# Patient Record
Sex: Male | Born: 1965 | Race: Black or African American | Hispanic: No | Marital: Single | Smoking: Never smoker
Health system: Southern US, Community
[De-identification: ages and names within clinical notes are randomized; demographics above are authoritative.]

## PROBLEM LIST (undated history)

## (undated) DIAGNOSIS — J45909 Unspecified asthma, uncomplicated: Secondary | ICD-10-CM

## (undated) DIAGNOSIS — I10 Essential (primary) hypertension: Secondary | ICD-10-CM

## (undated) HISTORY — PX: CYST EXCISION: SHX5701

---

## 2018-06-05 ENCOUNTER — Encounter (HOSPITAL_COMMUNITY): Payer: Self-pay | Admitting: *Deleted

## 2018-06-05 ENCOUNTER — Emergency Department (HOSPITAL_COMMUNITY): Payer: Self-pay

## 2018-06-05 ENCOUNTER — Emergency Department (HOSPITAL_COMMUNITY)
Admission: EM | Admit: 2018-06-05 | Discharge: 2018-06-05 | Disposition: A | Discharge status: Home / Self Care | Payer: Self-pay | Attending: Emergency Medicine | Admitting: Emergency Medicine

## 2018-06-05 DIAGNOSIS — Z87891 Personal history of nicotine dependence: Secondary | ICD-10-CM | POA: Insufficient documentation

## 2018-06-05 DIAGNOSIS — Y998 Other external cause status: Secondary | ICD-10-CM | POA: Insufficient documentation

## 2018-06-05 DIAGNOSIS — S86812A Strain of other muscle(s) and tendon(s) at lower leg level, left leg, initial encounter: Secondary | ICD-10-CM | POA: Insufficient documentation

## 2018-06-05 DIAGNOSIS — Y92310 Basketball court as the place of occurrence of the external cause: Secondary | ICD-10-CM | POA: Insufficient documentation

## 2018-06-05 DIAGNOSIS — Y9367 Activity, basketball: Secondary | ICD-10-CM | POA: Insufficient documentation

## 2018-06-05 DIAGNOSIS — W500XXA Accidental hit or strike by another person, initial encounter: Secondary | ICD-10-CM | POA: Insufficient documentation

## 2018-06-05 DIAGNOSIS — W19XXXA Unspecified fall, initial encounter: Secondary | ICD-10-CM

## 2018-06-05 MED ORDER — HYDROCODONE-ACETAMINOPHEN 5-325 MG PO TABS: 1.0000 | ORAL_TABLET | ORAL | 0 refills | Status: DC | PRN

## 2018-06-05 MED ORDER — HYDROCODONE-ACETAMINOPHEN 5-325 MG PO TABS
1.0000 | ORAL_TABLET | Freq: Once | ORAL | Status: AC
  Administered 2018-06-05: 1 via ORAL
  Filled 2018-06-05: qty 1

## 2018-06-05 NOTE — ED Notes (Signed)
Patient transported to X-ray 

## 2018-06-05 NOTE — ED Notes (Signed)
Discharge instructions and prescription discussed with Pt. Pt verbalized understanding. Pt stable and ambulatory with crutches.    

## 2018-06-05 NOTE — Discharge Instructions (Addendum)
Dr. Carola Frost is expecting you to call his office as soon as possible on Monday to speak about scheduling for surgery for your knee.   It is okay to bear some weight on your left knee if you would like.  Ice your knee as much as possible.  Please be advised that the medication prescribed is a narcotic and may make you sleepy or drowsy and may cause addiction or overdose even if taken as prescribed.  Please only take the medication for severe pain.

## 2018-06-05 NOTE — ED Triage Notes (Signed)
Pt reports he injured his t knee while playing basketball today

## 2018-06-05 NOTE — ED Provider Notes (Signed)
[facility] EMERGENCY DEPARTMENT Provider Note   CSN: [id number] Arrival date & time: 06/05/18  1844    History   Chief Complaint Chief Complaint  Patient presents with  . Knee Pain    HPI Miguel Delgado is a 53 y.o. male.     Patient is a 53 year old gentleman with no past medical history presents the emergency department for left knee injury after playing basketball today.  Patient reports that he was jumping up to go on for a lay up when someone ran into the lateral side of his left knee.  Reports immediate pain and falling to the ground.  Reports that it seemed like his knee was stuck in flexion when he tried to stand up and he had to push it out to extension on its own.  Reports minimal pain but a deformity to his knee and inability to walk correctly.     History reviewed. No pertinent past medical history.  There are no active problems to display for this patient.   History reviewed. No pertinent surgical history.      Home Medications    Prior to Admission medications   Medication Sig Start Date End Date Taking? Authorizing Provider  HYDROcodone-acetaminophen (NORCO/VICODIN) 5-325 MG tablet Take 1 tablet by mouth every 4 (four) hours as needed for up to 3 days. 06/05/18 06/08/18  Arlyn Dunning PA-C    Family History History reviewed. No pertinent family history.  Social History Social History   Tobacco Use  . Smoking status: Never Smoker  . Smokeless tobacco: Former Engineer, water Use Topics  . Alcohol use: Yes    Comment: SOCIAL  . Drug use: Not on file     Allergies   Patient has no known allergies.   Review of Systems Review of Systems   Physical Exam Updated Vital Signs BP (!) 140/95 (BP Location: Right Arm)   Pulse 92   Temp 99.2 F (37.3 C) (Oral)   Resp 14   Ht 5\' 9"  (1.753 m)   Wt 90.3 kg   SpO2 96%   BMI 29.39 kg/m   Physical Exam   ED Treatments / Results  Labs (all labs ordered are listed, but  only abnormal results are displayed) Labs Reviewed - No data to display  EKG None  Radiology Dg Knee Ap/lat W/sunrise Left  Result Date: 06/05/2018 CLINICAL DATA:  Knee injury playing basketball today EXAM: LEFT KNEE 3 VIEWS COMPARISON:  None. FINDINGS: Prominent patellar spurring at the quadriceps attachment site and patellar tendon attachment site. Faint calcification below the patella with surrounding indistinct stranding in Hoffa's fat pad, query patellar tendon injury or avulsion. Insall-Salvati ratio is 1.6, compatible with patella alta. IMPRESSION: 1. Patella alta, with indistinctness of tissue planes around the patellar tendon and a faint calcification in the expected region of the patellar tendon, patellar tendon tear or rupture is a distinct possibility. Correlate with excessive mobility of the patella. 2. Quadriceps and patellar tendon spurring along the patellar margins. Electronically Signed   By: Gaylyn Rong M.D.   On: 06/05/2018 19:39    Procedures Procedures (including critical care time)  Medications Ordered in ED Medications  HYDROcodone-acetaminophen (NORCO/VICODIN) 5-325 MG per tablet 1 tablet (has no administration in time range)     Initial Impression / Assessment and Plan / ED Course  I have reviewed the triage vital signs and the nursing notes.  Pertinent labs & imaging results that were available during my care of the patient were  reviewed by me and considered in my medical decision making (see chart for details).  Clinical Course as of Jun 05 1998  Sat Jun 05, 2018  1959 I discussed the case with Dr. Marcello Fennel, orthopedic surgeon on call.  Advised to place in the immobilizer and have him call the office for scheduling surgery on Monday.  Plan discussed with patient.  I discussed treatment options for the patients pain to include narcotic and non-narcotic medications. I discussed the risks of narcotic medications in full detail to include overdose and  addiction. Patient verbalized full understanding of risks of narcotic medication but still insisted to take rx for narcotic pain medication due to the severity of their pain.  Prior to providing a prescription for a controlled substance, I independently reviewed the patient's recent prescription history on the [place] Controlled Substance Reporting System. The patient had no recent or regular prescriptions and was deemed appropriate for a brief, less than 3 day prescription of narcotic for acute analgesia.     [KM]    Clinical Course User Index [KM] Arlyn Dunning, PA-C         Final Clinical Impressions(s) / ED Diagnoses   Final diagnoses:  Patellar tendon rupture, left, initial encounter  Fall, initial encounter    ED Discharge Orders         Ordered    HYDROcodone-acetaminophen (NORCO/VICODIN) 5-325 MG tablet  Every 4 hours PRN     06/05/18 1959           Jeral Pinch 06/05/18 Ritta Slot, MD 06/05/18 2105

## 2018-06-07 ENCOUNTER — Encounter (HOSPITAL_COMMUNITY): Payer: Self-pay | Admitting: *Deleted

## 2018-06-07 ENCOUNTER — Other Ambulatory Visit: Payer: Self-pay

## 2018-06-07 NOTE — Progress Notes (Signed)
Denies chest pain, shob, or cardiology visit. Denies new cough, sore throat, unexplained body aches, or fever > 100. Denies travel history out of state. Made aware of visitor restrictions and verbalized understanding.

## 2018-06-08 ENCOUNTER — Ambulatory Visit (HOSPITAL_COMMUNITY): Payer: Self-pay | Admitting: Certified Registered Nurse Anesthetist

## 2018-06-08 ENCOUNTER — Other Ambulatory Visit: Payer: Self-pay

## 2018-06-08 ENCOUNTER — Encounter (HOSPITAL_COMMUNITY)
Admission: RE | Disposition: A | Discharge status: Home / Self Care | Payer: Self-pay | Source: Home / Self Care | Attending: Orthopedic Surgery

## 2018-06-08 ENCOUNTER — Encounter (HOSPITAL_COMMUNITY): Payer: Self-pay

## 2018-06-08 ENCOUNTER — Ambulatory Visit (HOSPITAL_COMMUNITY): Payer: Self-pay

## 2018-06-08 ENCOUNTER — Ambulatory Visit (HOSPITAL_COMMUNITY)
Admission: RE | Admit: 2018-06-08 | Discharge: 2018-06-08 | Disposition: A | Discharge status: Home / Self Care | Payer: Self-pay | Attending: Orthopedic Surgery | Admitting: Orthopedic Surgery

## 2018-06-08 DIAGNOSIS — Z419 Encounter for procedure for purposes other than remedying health state, unspecified: Secondary | ICD-10-CM

## 2018-06-08 DIAGNOSIS — Y9367 Activity, basketball: Secondary | ICD-10-CM | POA: Insufficient documentation

## 2018-06-08 DIAGNOSIS — Z01818 Encounter for other preprocedural examination: Secondary | ICD-10-CM

## 2018-06-08 DIAGNOSIS — S86812A Strain of other muscle(s) and tendon(s) at lower leg level, left leg, initial encounter: Secondary | ICD-10-CM | POA: Insufficient documentation

## 2018-06-08 DIAGNOSIS — S86819A Strain of other muscle(s) and tendon(s) at lower leg level, unspecified leg, initial encounter: Secondary | ICD-10-CM

## 2018-06-08 DIAGNOSIS — J45909 Unspecified asthma, uncomplicated: Secondary | ICD-10-CM | POA: Insufficient documentation

## 2018-06-08 DIAGNOSIS — Z79899 Other long term (current) drug therapy: Secondary | ICD-10-CM | POA: Insufficient documentation

## 2018-06-08 DIAGNOSIS — I10 Essential (primary) hypertension: Secondary | ICD-10-CM | POA: Insufficient documentation

## 2018-06-08 DIAGNOSIS — Z87891 Personal history of nicotine dependence: Secondary | ICD-10-CM | POA: Insufficient documentation

## 2018-06-08 HISTORY — PX: REPAIR OF RUPTURED PATELLA LIGAMENT: SHX6066

## 2018-06-08 HISTORY — DX: Essential (primary) hypertension: I10

## 2018-06-08 HISTORY — DX: Unspecified asthma, uncomplicated: J45.909

## 2018-06-08 LAB — COMPREHENSIVE METABOLIC PANEL
ALT: 21 U/L (ref 0–44)
AST: 19 U/L (ref 15–41)
Albumin: 3.4 g/dL — ABNORMAL LOW (ref 3.5–5.0)
Alkaline Phosphatase: 62 U/L (ref 38–126)
Anion gap: 9 (ref 5–15)
BUN: 18 mg/dL (ref 6–20)
CO2: 23 mmol/L (ref 22–32)
Calcium: 8.7 mg/dL — ABNORMAL LOW (ref 8.9–10.3)
Chloride: 106 mmol/L (ref 98–111)
Creatinine, Ser: 0.99 mg/dL (ref 0.61–1.24)
GFR calc Af Amer: >60 mL/min (ref >60)
Glucose, Bld: 105 mg/dL — ABNORMAL HIGH (ref 70–99)
Potassium: 3.6 mmol/L (ref 3.5–5.1)
Sodium: 138 mmol/L (ref 135–145)
Total Bilirubin: 1 mg/dL (ref 0.3–1.2)
Total Protein: 6.8 g/dL (ref 6.5–8.1)

## 2018-06-08 LAB — CBC
HCT: 49.6 % (ref 39.0–52.0)
Hemoglobin: 15.4 g/dL (ref 13.0–17.0)
MCH: 24.8 pg — AB (ref 26.0–34.0)
MCHC: 31 g/dL (ref 30.0–36.0)
MCV: 79.7 fL — ABNORMAL LOW (ref 80.0–100.0)
Platelets: 197 10*3/uL (ref 150–400)
RBC: 6.22 MIL/uL — ABNORMAL HIGH (ref 4.22–5.81)
RDW: 15.3 % (ref 11.5–15.5)
WBC: 6.4 10*3/uL (ref 4.0–10.5)
nRBC: 0 % (ref 0.0–0.2)

## 2018-06-08 SURGERY — REPAIR OF RUPTURED PATELLA LIGAMENT
Anesthesia: General | Laterality: Left

## 2018-06-08 MED ORDER — PROMETHAZINE HCL 25 MG/ML IJ SOLN: 6.2500 mg | INTRAMUSCULAR | Status: DC | PRN

## 2018-06-08 MED ORDER — FENTANYL CITRATE (PF) 250 MCG/5ML IJ SOLN
INTRAMUSCULAR | Status: AC
  Filled 2018-06-08: qty 5

## 2018-06-08 MED ORDER — EPHEDRINE 5 MG/ML INJ
INTRAVENOUS | Status: AC
  Filled 2018-06-08: qty 10

## 2018-06-08 MED ORDER — OXYCODONE HCL 5 MG PO TABS: 5.0000 mg | ORAL_TABLET | Freq: Once | ORAL | Status: DC | PRN

## 2018-06-08 MED ORDER — DEXAMETHASONE SODIUM PHOSPHATE 10 MG/ML IJ SOLN
INTRAMUSCULAR | Status: DC | PRN
  Administered 2018-06-08: 5 mg via INTRAVENOUS

## 2018-06-08 MED ORDER — FENTANYL CITRATE (PF) 250 MCG/5ML IJ SOLN
INTRAMUSCULAR | Status: DC | PRN
  Administered 2018-06-08 (×2): 25 ug via INTRAVENOUS
  Administered 2018-06-08 (×2): 50 ug via INTRAVENOUS
  Administered 2018-06-08: 25 ug via INTRAVENOUS

## 2018-06-08 MED ORDER — EPHEDRINE SULFATE 50 MG/ML IJ SOLN
INTRAMUSCULAR | Status: DC | PRN
  Administered 2018-06-08 (×2): 10 mg via INTRAVENOUS

## 2018-06-08 MED ORDER — SUCCINYLCHOLINE CHLORIDE 20 MG/ML IJ SOLN
INTRAMUSCULAR | Status: DC | PRN
  Administered 2018-06-08: 120 mg via INTRAVENOUS

## 2018-06-08 MED ORDER — ONDANSETRON HCL 4 MG/2ML IJ SOLN
INTRAMUSCULAR | Status: AC
  Filled 2018-06-08: qty 2

## 2018-06-08 MED ORDER — KETOROLAC TROMETHAMINE 10 MG PO TABS: 10.0000 mg | ORAL_TABLET | Freq: Four times a day (QID) | ORAL | 0 refills | Status: AC | PRN

## 2018-06-08 MED ORDER — HYDROCODONE-ACETAMINOPHEN 5-325 MG PO TABS: 1.0000 | ORAL_TABLET | Freq: Four times a day (QID) | ORAL | 0 refills | Status: AC | PRN

## 2018-06-08 MED ORDER — LIDOCAINE 2% (20 MG/ML) 5 ML SYRINGE
INTRAMUSCULAR | Status: AC
  Filled 2018-06-08: qty 5

## 2018-06-08 MED ORDER — CEFAZOLIN SODIUM-DEXTROSE 2-4 GM/100ML-% IV SOLN
2.0000 g | INTRAVENOUS | Status: AC
  Administered 2018-06-08: 2 g via INTRAVENOUS

## 2018-06-08 MED ORDER — MIDAZOLAM HCL 2 MG/2ML IJ SOLN
INTRAMUSCULAR | Status: DC | PRN
  Administered 2018-06-08: 2 mg via INTRAVENOUS

## 2018-06-08 MED ORDER — LACTATED RINGERS IV SOLN
INTRAVENOUS | Status: DC
  Administered 2018-06-08 (×2): via INTRAVENOUS

## 2018-06-08 MED ORDER — ONDANSETRON 4 MG PO TBDP: 4.0000 mg | ORAL_TABLET | Freq: Three times a day (TID) | ORAL | 0 refills | Status: AC | PRN

## 2018-06-08 MED ORDER — ROPIVACAINE HCL 5 MG/ML IJ SOLN
INTRAMUSCULAR | Status: DC | PRN
  Administered 2018-06-08: 30 mL via PERINEURAL

## 2018-06-08 MED ORDER — CEFAZOLIN SODIUM-DEXTROSE 2-4 GM/100ML-% IV SOLN
INTRAVENOUS | Status: AC
  Filled 2018-06-08: qty 100

## 2018-06-08 MED ORDER — ONDANSETRON HCL 4 MG/2ML IJ SOLN
INTRAMUSCULAR | Status: DC | PRN
  Administered 2018-06-08: 4 mg via INTRAVENOUS

## 2018-06-08 MED ORDER — FENTANYL CITRATE (PF) 100 MCG/2ML IJ SOLN
INTRAMUSCULAR | Status: AC
  Administered 2018-06-08: 100 ug via INTRAVENOUS
  Filled 2018-06-08: qty 2

## 2018-06-08 MED ORDER — LIDOCAINE 2% (20 MG/ML) 5 ML SYRINGE
INTRAMUSCULAR | Status: DC | PRN
  Administered 2018-06-08: 80 mg via INTRAVENOUS

## 2018-06-08 MED ORDER — GABAPENTIN 300 MG PO CAPS
ORAL_CAPSULE | ORAL | Status: AC
  Administered 2018-06-08: 300 mg via ORAL
  Filled 2018-06-08: qty 1

## 2018-06-08 MED ORDER — PHENYLEPHRINE 40 MCG/ML (10ML) SYRINGE FOR IV PUSH (FOR BLOOD PRESSURE SUPPORT)
PREFILLED_SYRINGE | INTRAVENOUS | Status: AC
  Filled 2018-06-08: qty 10

## 2018-06-08 MED ORDER — METHOCARBAMOL 500 MG PO TABS: 500.0000 mg | ORAL_TABLET | Freq: Four times a day (QID) | ORAL | 1 refills | Status: AC

## 2018-06-08 MED ORDER — FENTANYL CITRATE (PF) 100 MCG/2ML IJ SOLN
100.0000 ug | Freq: Once | INTRAMUSCULAR | Status: AC
  Administered 2018-06-08: 100 ug via INTRAVENOUS

## 2018-06-08 MED ORDER — DEXAMETHASONE SODIUM PHOSPHATE 10 MG/ML IJ SOLN
INTRAMUSCULAR | Status: AC
  Filled 2018-06-08: qty 1

## 2018-06-08 MED ORDER — SUCCINYLCHOLINE CHLORIDE 200 MG/10ML IV SOSY
PREFILLED_SYRINGE | INTRAVENOUS | Status: AC
  Filled 2018-06-08: qty 10

## 2018-06-08 MED ORDER — MIDAZOLAM HCL 2 MG/2ML IJ SOLN
2.0000 mg | Freq: Once | INTRAMUSCULAR | Status: AC
  Administered 2018-06-08: 2 mg via INTRAVENOUS

## 2018-06-08 MED ORDER — KETOROLAC TROMETHAMINE 15 MG/ML IJ SOLN: 15.0000 mg | Freq: Once | INTRAMUSCULAR | Status: DC

## 2018-06-08 MED ORDER — MIDAZOLAM HCL 2 MG/2ML IJ SOLN
INTRAMUSCULAR | Status: AC
  Filled 2018-06-08: qty 2

## 2018-06-08 MED ORDER — CHLORHEXIDINE GLUCONATE 4 % EX LIQD: 60.0000 mL | Freq: Once | CUTANEOUS | Status: DC

## 2018-06-08 MED ORDER — POVIDONE-IODINE 10 % EX SWAB: 2.0000 "application " | Freq: Once | CUTANEOUS | Status: DC

## 2018-06-08 MED ORDER — PROPOFOL 10 MG/ML IV BOLUS
INTRAVENOUS | Status: AC
  Filled 2018-06-08: qty 40

## 2018-06-08 MED ORDER — ACETAMINOPHEN 500 MG PO TABS
ORAL_TABLET | ORAL | Status: AC
  Administered 2018-06-08: 1000 mg via ORAL
  Filled 2018-06-08: qty 2

## 2018-06-08 MED ORDER — CELECOXIB 200 MG PO CAPS
400.0000 mg | ORAL_CAPSULE | Freq: Once | ORAL | Status: AC
  Administered 2018-06-08: 400 mg via ORAL

## 2018-06-08 MED ORDER — MEPERIDINE HCL 50 MG/ML IJ SOLN: 6.2500 mg | INTRAMUSCULAR | Status: DC | PRN

## 2018-06-08 MED ORDER — CELECOXIB 200 MG PO CAPS
ORAL_CAPSULE | ORAL | Status: AC
  Administered 2018-06-08: 400 mg via ORAL
  Filled 2018-06-08: qty 2

## 2018-06-08 MED ORDER — OXYCODONE HCL 5 MG/5ML PO SOLN: 5.0000 mg | Freq: Once | ORAL | Status: DC | PRN

## 2018-06-08 MED ORDER — MIDAZOLAM HCL 2 MG/2ML IJ SOLN
INTRAMUSCULAR | Status: AC
  Administered 2018-06-08: 2 mg via INTRAVENOUS
  Filled 2018-06-08: qty 2

## 2018-06-08 MED ORDER — ACETAMINOPHEN 500 MG PO TABS
1000.0000 mg | ORAL_TABLET | Freq: Once | ORAL | Status: AC
  Administered 2018-06-08: 1000 mg via ORAL

## 2018-06-08 MED ORDER — PROPOFOL 10 MG/ML IV BOLUS
INTRAVENOUS | Status: DC | PRN
  Administered 2018-06-08: 200 mg via INTRAVENOUS

## 2018-06-08 MED ORDER — GABAPENTIN 300 MG PO CAPS
300.0000 mg | ORAL_CAPSULE | Freq: Once | ORAL | Status: AC
  Administered 2018-06-08: 300 mg via ORAL

## 2018-06-08 MED ORDER — PHENYLEPHRINE HCL 10 MG/ML IJ SOLN
INTRAMUSCULAR | Status: DC | PRN
  Administered 2018-06-08: 120 ug via INTRAVENOUS
  Administered 2018-06-08 (×3): 80 ug via INTRAVENOUS

## 2018-06-08 MED ORDER — HYDROMORPHONE HCL 1 MG/ML IJ SOLN: 0.2500 mg | INTRAMUSCULAR | Status: DC | PRN

## 2018-06-08 SURGICAL SUPPLY — 61 items
BANDAGE ACE 4X5 VEL STRL LF (GAUZE/BANDAGES/DRESSINGS) ×3 IMPLANT
BANDAGE ACE 6X5 VEL STRL LF (GAUZE/BANDAGES/DRESSINGS) ×3 IMPLANT
BLADE CLIPPER SURG (BLADE) ×3 IMPLANT
BLADE SURG 10 STRL SS (BLADE) IMPLANT
BNDG GAUZE ELAST 4 BULKY (GAUZE/BANDAGES/DRESSINGS) ×6 IMPLANT
BRUSH SCRUB SURG 4.25 DISP (MISCELLANEOUS) ×6 IMPLANT
COVER SURGICAL LIGHT HANDLE (MISCELLANEOUS) ×6 IMPLANT
COVER WAND RF STERILE (DRAPES) ×3 IMPLANT
CUFF TOURNIQUET SINGLE 34IN LL (TOURNIQUET CUFF) IMPLANT
CUFF TOURNIQUET SINGLE 44IN (TOURNIQUET CUFF) IMPLANT
DECANTER SPIKE VIAL GLASS SM (MISCELLANEOUS) IMPLANT
DRAPE C-ARM 42X72 X-RAY (DRAPES) ×2 IMPLANT
DRAPE C-ARMOR (DRAPES) ×3 IMPLANT
DRSG ADAPTIC 3X8 NADH LF (GAUZE/BANDAGES/DRESSINGS) ×3 IMPLANT
DRSG EMULSION OIL 3X3 NADH (GAUZE/BANDAGES/DRESSINGS) ×3 IMPLANT
DRSG PAD ABDOMINAL 8X10 ST (GAUZE/BANDAGES/DRESSINGS) ×3 IMPLANT
ELECT REM PT RETURN 9FT ADLT (ELECTROSURGICAL) ×3
ELECTRODE REM PT RTRN 9FT ADLT (ELECTROSURGICAL) ×1 IMPLANT
GAUZE SPONGE 4X4 12PLY STRL (GAUZE/BANDAGES/DRESSINGS) ×3 IMPLANT
GLOVE BIO SURGEON STRL SZ7.5 (GLOVE) ×3 IMPLANT
GLOVE BIO SURGEON STRL SZ8 (GLOVE) ×3 IMPLANT
GLOVE BIOGEL PI IND STRL 7.5 (GLOVE) ×1 IMPLANT
GLOVE BIOGEL PI IND STRL 8 (GLOVE) ×1 IMPLANT
GLOVE BIOGEL PI INDICATOR 7.5 (GLOVE) ×2
GLOVE BIOGEL PI INDICATOR 8 (GLOVE) ×2
GOWN STRL REUS W/ TWL XL LVL3 (GOWN DISPOSABLE) ×1 IMPLANT
GOWN STRL REUS W/TWL 2XL LVL3 (GOWN DISPOSABLE) ×6 IMPLANT
GOWN STRL REUS W/TWL XL LVL3 (GOWN DISPOSABLE) ×2
KIT BASIN OR (CUSTOM PROCEDURE TRAY) ×3 IMPLANT
KIT TURNOVER KIT B (KITS) ×3 IMPLANT
MANIFOLD NEPTUNE II (INSTRUMENTS) ×3 IMPLANT
NEEDLE 22X1 1/2 (OR ONLY) (NEEDLE) IMPLANT
NS IRRIG 1000ML POUR BTL (IV SOLUTION) ×3 IMPLANT
PACK ORTHO EXTREMITY (CUSTOM PROCEDURE TRAY) ×3 IMPLANT
PAD ABD 8X10 STRL (GAUZE/BANDAGES/DRESSINGS) ×3 IMPLANT
PAD ARMBOARD 7.5X6 YLW CONV (MISCELLANEOUS) ×6 IMPLANT
PAD CAST 4YDX4 CTTN HI CHSV (CAST SUPPLIES) ×1 IMPLANT
PADDING CAST COTTON 4X4 STRL (CAST SUPPLIES) ×2
PADDING CAST COTTON 6X4 STRL (CAST SUPPLIES) ×3 IMPLANT
PASSER SUT SWANSON 36MM LOOP (INSTRUMENTS) IMPLANT
PIN GUIDE FEMORAL STERILE (PIN) ×3 IMPLANT
STAPLER VISISTAT 35W (STAPLE) ×3 IMPLANT
SUCTION FRAZIER HANDLE 10FR (MISCELLANEOUS) ×2
SUCTION TUBE FRAZIER 10FR DISP (MISCELLANEOUS) ×1 IMPLANT
SUT FIBERWIRE #2 38 T-5 BLUE (SUTURE)
SUT FIBERWIRE #5 38 CONV NDL (SUTURE) ×12
SUT STEEL 5 V 56 M (SUTURE) ×3 IMPLANT
SUT VIC AB 0 CT1 27 (SUTURE) ×2
SUT VIC AB 0 CT1 27XBRD ANBCTR (SUTURE) ×1 IMPLANT
SUT VIC AB 2-0 CT1 27 (SUTURE) ×4
SUT VIC AB 2-0 CT1 TAPERPNT 27 (SUTURE) ×2 IMPLANT
SUTURE FIBERWR #2 38 T-5 BLUE (SUTURE) IMPLANT
SUTURE FIBERWR #5 38 CONV NDL (SUTURE) ×4 IMPLANT
SYR CONTROL 10ML LL (SYRINGE) IMPLANT
TOWEL OR 17X24 6PK STRL BLUE (TOWEL DISPOSABLE) ×3 IMPLANT
TOWEL OR 17X26 10 PK STRL BLUE (TOWEL DISPOSABLE) ×6 IMPLANT
TUBE CONNECTING 12'X1/4 (SUCTIONS) ×1
TUBE CONNECTING 12X1/4 (SUCTIONS) ×2 IMPLANT
UNDERPAD 30X30 (UNDERPADS AND DIAPERS) ×3 IMPLANT
WATER STERILE IRR 1000ML POUR (IV SOLUTION) ×3 IMPLANT
YANKAUER SUCT BULB TIP NO VENT (SUCTIONS) IMPLANT

## 2018-06-08 NOTE — Op Note (Signed)
NAMERhylin, Noska Delgado MEDICAL RECORD [id number] ACCOUNT [id number] DATE OF BIRTH:Apr 18, 1965 FACILITY: [facility] LOCATION: [facility] PHYSICIAN:Thera Basden H. [name], MD  OPERATIVE REPORT  DATE OF PROCEDURE:  06/08/2018  PREOPERATIVE DIAGNOSIS:  Ruptured left patellar tendon.  POSTOPERATIVE DIAGNOSIS:  Ruptured left patellar tendon.  PROCEDURE:  Repair of ruptured patellar tendon including reconstruction of 50% of the insertion from the patella and 50% of the insertion onto the tibia.  SURGEON:  [name], MD  ASSISTANT:  None.  ANESTHESIA:  General.  ESTIMATED BLOOD LOSS:  100 mL.  COMPLICATIONS:  None.  SPECIMENS:  None.  TOURNIQUET:  None.  DISPOSITION:  To PACU.  CONDITION:  Stable.  INDICATIONS FOR PROCEDURE:  The patient is a very pleasant 53 year old male who was playing basketball.  He was fouled on a layup resulting in acute left knee pain and inability to actively straighten his knee.  I discussed with him preoperatively the  risks and benefits of patellar tendon reconstruction including the potential for loss of motion, weakness, symptomatic hardware or suture, the infection, nerve injury, vessel injury and multiple others.  He acknowledged these risks and provided consent  to proceed.  SUMMARY OF PROCEDURE:  The patient was taken to the operating room where general anesthesia was induced.  He did receive preoperative antibiotics.  His left lower extremity was then cleaned thoroughly with chlorhexidine soap and then Betadine scrub and  paint.  A standard prep and drape were performed.  A timeout was held.  We began with a midline incision centered over the patella.  After getting through the deep subcu, patellar tendon was immediately seen.  It was in a ruptured elastic band-type  configuration with strands from the patella that were thin and of the superficial half of the tendon that had been avulsed from the tibial tuberosity insertion.  Then there was half of the  patellar tendon structures that remained attached to the distal  tibial tendon that had avulsed from the patella proximally and were also in strands.  This was the deeper portion of the patellar tendon.  The knee joint was evaluated and appeared to be in good condition without free fragments of bone, articular injury,  or significant arthritis.  It was irrigated thoroughly, and the hematoma was evacuated.  We then secured the proximal aspect of the tendon by weaving two #2 FiberWire sutures in a Krakow pattern through each half of the tendon that was still attached to  the patella.  These 4 strands were then secured with hemostats, and attention turned to the distal stump of the tendon.  Here, another 2 sutures were used to create 4 limbs with Krakow stitch in similar pattern where we divided the tendon in half.   These strands were then brought up and through the deep inferior portion of the patella and brought out through this superiorly and tied over the top of the patella there eventually.  We then took the 4  limbs and used 2 passes, 1 with the Beath needle  through the lateral aspect of the lateral insertion and bringing it down medially and creating a bone tunnel here and then an additional tuberosity repaired with the medial superficial aspect of the tendon, passing that through a bone tunnel that was  created within the tibial tubercle with a drill straight anterior and another drill hole medial and passing the suture through this and tying it down into the bone.  Once the origin had been repaired deep and then the insertion had been repaired  superficially, the tendon itself deep to superficial was reinforced with another #2 FiberWire, again using the Krakow technique.  The knee was then taken through range of motion, showing no gapping and good tension of the repair.  X-rays were obtained to  check position of the patella, which looked appropriate.  The retinaculum was repaired with #1 Vicryl, the  paratenon was repaired with 0 Vicryl, and then a 2-0 Vicryl in the subcu and 2-0 nylon vertical mattress repair of the superficial skin layer.   Wounds were irrigated thoroughly and closed in standard layered fashion, and then Adaptic and gauze and an Ace wrap from foot to thigh applied as well as a knee immobilizer.  The patient was awakened from anesthesia and transported to the PACU in stable  condition.  PROGNOSIS:  The patient sustained a severe and complex patellar tendon rupture that required repair of both the origin and insertion of the tendon in order to have adequate tendon for function.  We will keep him in full extension in a knee immobilizer  for the first 2 weeks, then increase it by 30 degrees each subsequent 2 weeks.  He may begin gentle flexion to 30 degrees when prone starting now.  If he likes, I have also discussed the potential for gentle passive motion with his significant other,  though he is to avoid any active extension against resistance.  He works as a Airline pilot, and it may be 3-4 months and possibly longer before he is able to return to full safe function in this regard.  LN/NUANCE  D:06/08/2018 T:06/08/2018 JOB:[id number]

## 2018-06-08 NOTE — Anesthesia Procedure Notes (Signed)
Anesthesia Regional Block: Femoral nerve block   Pre-Anesthetic Checklist: ,, timeout performed, Correct Patient, Correct Site, Correct Laterality, Correct Procedure, Correct Position, site marked, Risks and benefits discussed,  Surgical consent,  Pre-op evaluation,  At surgeon's request and post-op pain management  Laterality: Left  Prep: chloraprep       Needles:  Injection technique: Single-shot  Needle Type: Stimiplex     Needle Length: 9cm  Needle Gauge: 21     Additional Needles:   Procedures:,,,, ultrasound used (permanent image in chart),,,,  Narrative:  Start time: 06/08/2018 7:25 AM End time: 06/08/2018 7:30 AM Injection made incrementally with aspirations every 5 mL.  Performed by: Personally  Anesthesiologist: Lowella Curb, MD

## 2018-06-08 NOTE — Anesthesia Procedure Notes (Signed)
Procedure Name: Intubation Date/Time: 06/08/2018 8:44 AM Performed by: Kathryne Hitch, CRNA Pre-anesthesia Checklist: Emergency Drugs available, Suction available, Patient identified, Patient being monitored and Timeout performed Patient Re-evaluated:Patient Re-evaluated prior to induction Oxygen Delivery Method: Circle system utilized Preoxygenation: Pre-oxygenation with 100% oxygen Induction Type: IV induction Laryngoscope Size: Mac and 4 Grade View: Grade I Tube type: Oral Tube size: 7.5 mm Number of attempts: 1 Airway Equipment and Method: Stylet Placement Confirmation: ETT inserted through vocal cords under direct vision,  positive ETCO2 and breath sounds checked- equal and bilateral Secured at: 23 cm Tube secured with: Tape Dental Injury: Teeth and Oropharynx as per pre-operative assessment

## 2018-06-08 NOTE — H&P (Signed)
Orthopaedic Trauma Service Consultation  Miguel Delgado is an 53 y.o. male.  HPI: Playing basketball and fouled on lay-up resulting in acute left knee pain and loss of active extension. Plain films showed superior retraction of the patella. Norco for pain. No other injuries.   Past Medical History:  Diagnosis Date  . Asthma   . Hypertension     Past Surgical History:  Procedure Laterality Date  . CYST EXCISION     left eye    History reviewed. No pertinent family history.  Social History:  reports that he has never smoked. He has quit using smokeless tobacco. He reports current alcohol use. He reports that he does not use drugs.  Allergies: No Known Allergies  Medications:  Prior to Admission:  Medications Prior to Admission  Medication Sig Dispense Refill Last Dose  . AMLODIPINE BENZOATE PO Take 5 mg by mouth daily.   06/07/2018 at 2000  . Fluticasone-Salmeterol (ADVAIR) 250-50 MCG/DOSE AEPB Inhale 1 puff into the lungs 2 (two) times daily.   06/08/2018 at Unknown time  . HYDROcodone-acetaminophen (NORCO/VICODIN) 5-325 MG tablet Take 1 tablet by mouth every 4 (four) hours as needed for up to 3 days. 10 tablet 0 Past Week at Unknown time    Results for orders placed or performed during the hospital encounter of 06/08/18 (from the past 48 hour(s))  CBC     Status: Abnormal   Collection Time: 06/08/18  6:15 AM  Result Value Ref Range   WBC 6.4 4.0 - 10.5 K/uL   RBC 6.22 (H) 4.22 - 5.81 MIL/uL   Hemoglobin 15.4 13.0 - 17.0 g/dL   HCT 78.9 38.1 - 01.7 %   MCV 79.7 (L) 80.0 - 100.0 fL   MCH 24.8 (L) 26.0 - 34.0 pg   MCHC 31.0 30.0 - 36.0 g/dL   RDW 51.0 25.8 - 52.7 %   Platelets 197 150 - 400 K/uL   nRBC 0.0 0.0 - 0.2 %    Comment: Performed at [facility], [address], [place], [place] [address]  Comprehensive metabolic panel     Status: Abnormal   Collection Time: 06/08/18  6:15 AM  Result Value Ref Range   Sodium 138 135 - 145 mmol/L   Potassium 3.6 3.5 - 5.1  mmol/L   Chloride 106 98 - 111 mmol/L   CO2 23 22 - 32 mmol/L   Glucose, Bld 105 (H) 70 - 99 mg/dL   BUN 18 6 - 20 mg/dL   Creatinine, Ser 3.53 0.61 - 1.24 mg/dL   Calcium 8.7 (L) 8.9 - 10.3 mg/dL   Total Protein 6.8 6.5 - 8.1 g/dL   Albumin 3.4 (L) 3.5 - 5.0 g/dL   AST 19 15 - 41 U/L   ALT 21 0 - 44 U/L   Alkaline Phosphatase 62 38 - 126 U/L   Total Bilirubin 1.0 0.3 - 1.2 mg/dL   GFR calc non Af Amer >60 >60 mL/min   GFR calc Af Amer >60 >60 mL/min   Anion gap 9 5 - 15    Comment: Performed at [facility], [address], [place], [place] [address]    Dg Chest Portable 1 View  Result Date: 06/08/2018 CLINICAL DATA:  Preop patellar ligament repair. Hypertension, asthma EXAM: PORTABLE CHEST 1 VIEW COMPARISON:  None. FINDINGS: Heart is mildly enlarged. Lungs clear. No effusions or edema. No acute bony abnormality. IMPRESSION: Mild cardiomegaly.  No active disease. Electronically Signed   By: Charlett Nose  M.D.   On: 06/08/2018 07:15    ROS No recent fever, bleeding abnormalities, urologic dysfunction, GI problems, or weight gain. Blood pressure 135/65, pulse 78, temperature 99 F (37.2 C), temperature source Oral, resp. rate 12, height 5\' 9"  (1.753 m), weight 90.3 kg, SpO2 98 %. Physical Exam NCAT RRR CTA S/NT/ND LLE Tender swollen left knee with no extension against resistance  Edema/ swelling controlled  Sens: DPN, SPN, TN intact  Motor: EHL, FHL, and lessor toe ext and flex all intact grossly  Brisk cap refill, warm to touch, DP 2+  Assessment/Plan: Left patellar tendon disruption   Operative repair and d/c home post op  I discussed with the patient the risks and benefits of surgery, including the possibility of infection, nerve injury, vessel injury, wound breakdown, arthritis, symptomatic suture, DVT/ PE, loss of motion, and need for further surgery among others.  He acknowledged these risks and wished to proceed.   [name], MD [facility], [facility] [phone]  06/08/2018  8:23 AM

## 2018-06-08 NOTE — Transfer of Care (Signed)
Immediate Anesthesia Transfer of Care Note  Patient: Miguel Delgado  Procedure(s) Performed: REPAIR OF RUPTURED PATELLA LIGAMENT (Left )  Patient Location: PACU  Anesthesia Type:General  Level of Consciousness: awake and drowsy  Airway & Oxygen Therapy: Patient Spontanous Breathing and Patient connected to face mask oxygen  Post-op Assessment: Report given to RN and Post -op Vital signs reviewed and stable  Post vital signs: Reviewed and stable  Last Vitals:  Vitals Value Taken Time  BP 146/94 06/08/2018 11:21 AM  Temp    Pulse 87 06/08/2018 11:22 AM  Resp 16 06/08/2018 11:22 AM  SpO2 98 % 06/08/2018 11:22 AM  Vitals shown include unvalidated device data.  Last Pain:  Vitals:   06/08/18 0644  TempSrc:   PainSc: 1       Patients Stated Pain Goal: 0 (06/08/18 2376)  Complications: No apparent anesthesia complications

## 2018-06-08 NOTE — Anesthesia Postprocedure Evaluation (Signed)
Anesthesia Post Note  Patient: Miguel Delgado  Procedure(s) Performed: REPAIR OF RUPTURED PATELLA LIGAMENT (Left )     Patient location during evaluation: PACU Anesthesia Type: General Level of consciousness: awake and alert Pain management: pain level controlled Vital Signs Assessment: post-procedure vital signs reviewed and stable Respiratory status: spontaneous breathing, nonlabored ventilation and respiratory function stable Cardiovascular status: blood pressure returned to baseline and stable Postop Assessment: no apparent nausea or vomiting Anesthetic complications: no    Last Vitals:  Vitals:   06/08/18 1150 06/08/18 1205  BP: (!) 149/77 (!) 143/82  Pulse: 86 97  Resp: 12 14  Temp:  (!) 36.3 C  SpO2: 92% 96%    Last Pain:  Vitals:   06/08/18 1205  TempSrc:   PainSc: 0-No pain                 Lowella Curb

## 2018-06-08 NOTE — Brief Op Note (Signed)
06/08/2018  11:48 AM  PATIENT:  Miguel Delgado  53 y.o. male  PRE-OPERATIVE DIAGNOSIS:  Left Ruptured Patella Tendon  POST-OPERATIVE DIAGNOSIS:  Left Ruptured Patella Tendon  PROCEDURE:  Procedure(s): REPAIR OF RUPTURED PATELLA TENDON (Left) with Partial Avulsions at both Patellar Origin and Tibial Insertion  SURGEON:  Surgeon(s) and Role:    [name], MD - Primary  ASSISTANTS: none   ANESTHESIA:   general  EBL:  100 mL   BLOOD ADMINISTERED:none  DRAINS: none   LOCAL MEDICATIONS USED:  NONE  SPECIMEN:  No Specimen  DISPOSITION OF SPECIMEN:  N/A  COUNTS:  YES  TOURNIQUET:  * No tourniquets in log *  DICTATION: [id number]  PLAN OF CARE: Discharge to home after PACU  PATIENT DISPOSITION:  PACU - hemodynamically stable.   Delay start of Pharmacological VTE agent (>24hrs) due to surgical blood loss or risk of bleeding: no

## 2018-06-08 NOTE — Anesthesia Preprocedure Evaluation (Signed)
Anesthesia Evaluation  Patient identified by MRN, date of birth, ID band Patient awake    Reviewed: Allergy & Precautions, NPO status , Patient's Chart, lab work & pertinent test results  Airway Mallampati: II  TM Distance: >3 FB Neck ROM: Full    Dental no notable dental hx.    Pulmonary asthma ,    Pulmonary exam normal breath sounds clear to auscultation       Cardiovascular hypertension, Pt. on medications negative cardio ROS Normal cardiovascular exam Rhythm:Regular Rate:Normal     Neuro/Psych negative neurological ROS  negative psych ROS   GI/Hepatic negative GI ROS, Neg liver ROS,   Endo/Other  negative endocrine ROS  Renal/GU negative Renal ROS  negative genitourinary   Musculoskeletal negative musculoskeletal ROS (+)   Abdominal   Peds negative pediatric ROS (+)  Hematology negative hematology ROS (+)   Anesthesia Other Findings   Reproductive/Obstetrics negative OB ROS                             Anesthesia Physical Anesthesia Plan  ASA: II  Anesthesia Plan: General   Post-op Pain Management:    Induction: Intravenous  PONV Risk Score and Plan: 2 and Ondansetron and Midazolam  Airway Management Planned: Oral ETT  Additional Equipment:   Intra-op Plan:   Post-operative Plan: Extubation in OR  Informed Consent: I have reviewed the patients History and Physical, chart, labs and discussed the procedure including the risks, benefits and alternatives for the proposed anesthesia with the patient or authorized representative who has indicated his/her understanding and acceptance.     Dental advisory given  Plan Discussed with: CRNA  Anesthesia Plan Comments:         Anesthesia Quick Evaluation  

## 2018-06-09 ENCOUNTER — Encounter (HOSPITAL_COMMUNITY): Payer: Self-pay | Admitting: Orthopedic Surgery

## 2020-01-28 IMAGING — RF LEFT KNEE - 3 VIEW
1 series · 2 of 2 positions shown · non-contrast
Comparison: Right knee x-rays dated June 05, 2018.

CLINICAL DATA: Patellar tendon repair.

EXAM:
DG C-ARM 61-120 MIN; LEFT KNEE - 3 VIEW

[Series 1: run · 2 of 2 slices shown]
[im 1/2]
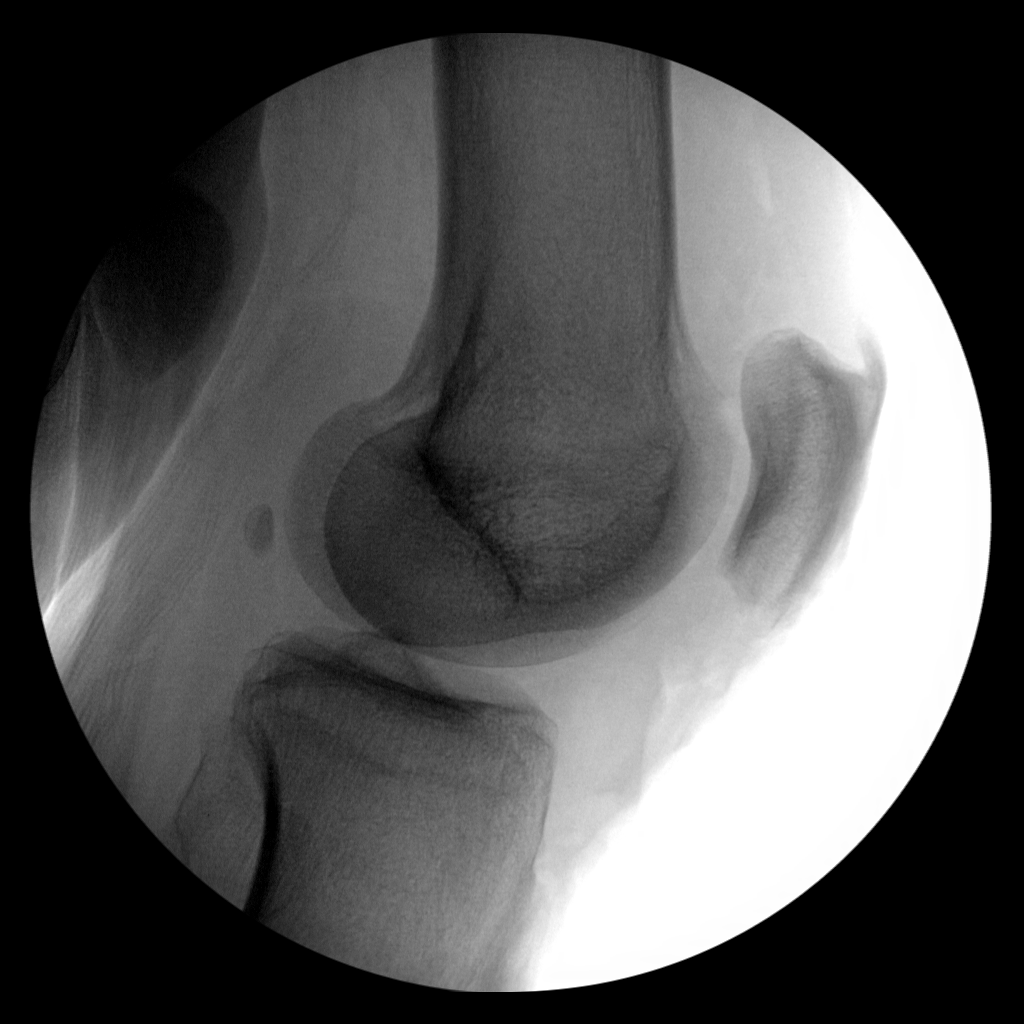
[im 2/2]
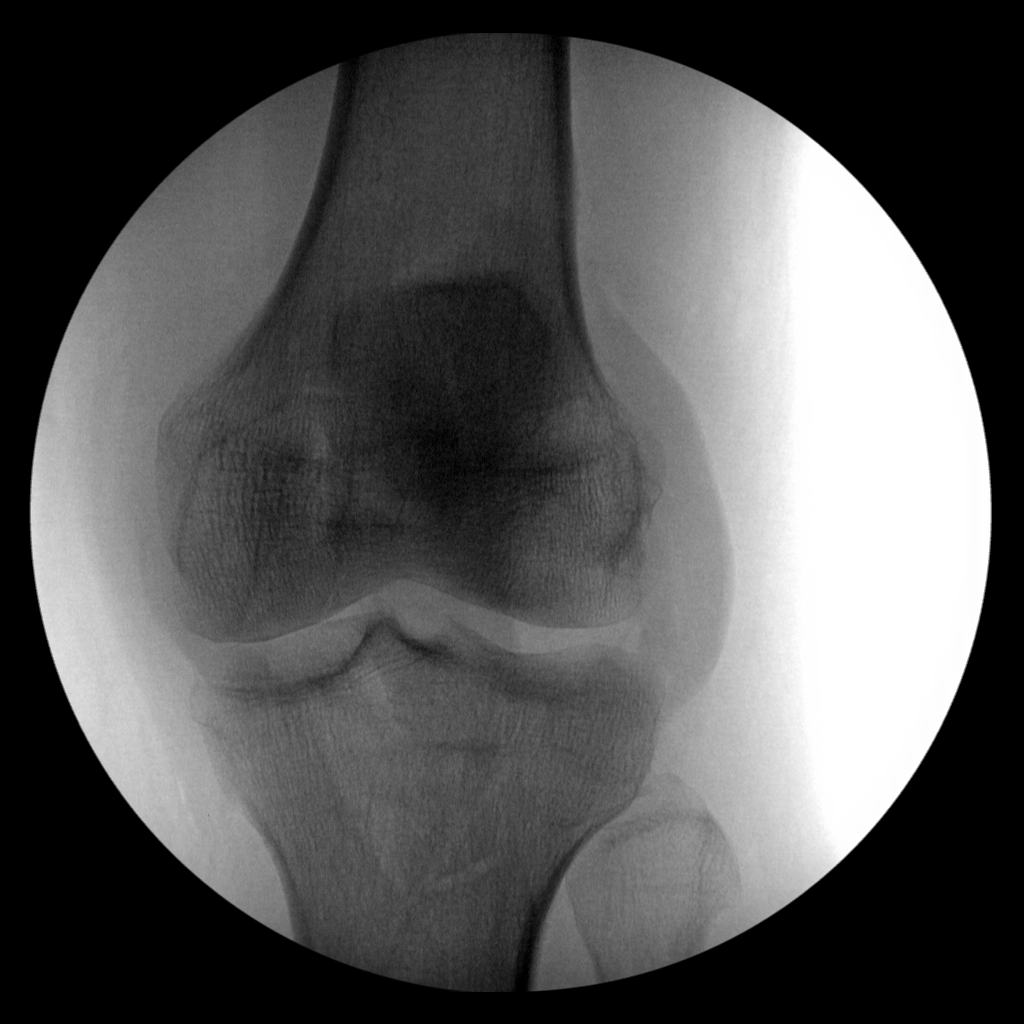

[2 of 2 positions shown; findings below may reference images not displayed]

FLUOROSCOPY TIME:  6 seconds.

C-arm fluoroscopic images were obtained intraoperatively and
submitted for post operative interpretation.
FINDINGS: AP and lateral intraoperative fluoroscopic images of the left knee
demonstrate normal alignment of the patella with resolved patella
alta. No acute osseous abnormality.
IMPRESSION: 1. Intraoperative fluoroscopic guidance as above, with now normal
alignment of the patella.
